# Patient Record
Sex: Male | Born: 1955 | Race: White | Hispanic: No | Smoking: Former smoker
Health system: Southern US, Community
[De-identification: ages and names within clinical notes are randomized; demographics above are authoritative.]

## PROBLEM LIST (undated history)

## (undated) DIAGNOSIS — E119 Type 2 diabetes mellitus without complications: Secondary | ICD-10-CM

## (undated) DIAGNOSIS — G20 Parkinson's disease: Secondary | ICD-10-CM

## (undated) DIAGNOSIS — F0280 Dementia in other diseases classified elsewhere without behavioral disturbance: Secondary | ICD-10-CM

## (undated) DIAGNOSIS — M5136 Other intervertebral disc degeneration, lumbar region: Secondary | ICD-10-CM

## (undated) DIAGNOSIS — N400 Enlarged prostate without lower urinary tract symptoms: Secondary | ICD-10-CM

## (undated) DIAGNOSIS — G309 Alzheimer's disease, unspecified: Secondary | ICD-10-CM

## (undated) HISTORY — DX: Dementia in other diseases classified elsewhere, unspecified severity, without behavioral disturbance, psychotic disturbance, mood disturbance, and anxiety: F02.80

## (undated) HISTORY — DX: Benign prostatic hyperplasia without lower urinary tract symptoms: N40.0

## (undated) HISTORY — DX: Alzheimer's disease, unspecified: G30.9

## (undated) HISTORY — DX: Other intervertebral disc degeneration, lumbar region: M51.36

## (undated) HISTORY — DX: Type 2 diabetes mellitus without complications: E11.9

## (undated) HISTORY — DX: Parkinson's disease: G20

## (undated) HISTORY — PX: NECK SURGERY: SHX720

## (undated) HISTORY — PX: BACK SURGERY: SHX140

---

## 2017-05-30 ENCOUNTER — Encounter: Payer: Self-pay | Admitting: Neurology

## 2017-06-15 ENCOUNTER — Encounter: Payer: Self-pay | Admitting: Neurology

## 2017-06-20 NOTE — Progress Notes (Signed)
Christopher Petersen was seen today in the movement disorders clinic for neurologic consultation at the request of Clayton Lefort, FNP.  The consultation is for the evaluation of PD.  The records that were made available to me were reviewed.   He is accompanied by his granddaughter who supplements the history.  Pt reports that he was seeing a neurologist in [place] (a Dr. Lennice Sites) who has since left.  I have no records of Dr. Seleta Rhymes.  Pt states that he was dx with PD about 6-8 months ago and was given samples of the neupro patch, 4 mg.  He took it for about 2 months and his granddaughter and he both state that it helped him move.  However, he couldn't afford the medication and he had to stop it.  Since then he is slower and his speech is worse.   Specific Symptoms:  Tremor: Yes.  , both hands, with use and with rest, and he feels tremor on the inside Family hx of similar:  Yes.  , maternal uncle with PD Voice: slurred speech, no hx of LSVT loud Sleep: trouble staying asleep  Vivid Dreams:  No.  Acting out dreams:  Yes.   (currently staying with daughter/grandkids as having trouble taking care of self - blood sugars were dropping per granddaughter) Wet Pillows: Yes.   Postural symptoms:  Yes.    Falls?  Yes.  , "right many" falls - last fall 2 weeks ago and fell off porch but no significant injury Bradykinesia symptoms: slow movements, difficulty getting out of a chair, difficulty regaining balance and "my feet outrun my body" Loss of smell:  No. Loss of taste:  No. Urinary Incontinence:  Yes.  , but no need to wear undergarment Difficulty Swallowing:  Yes.  , attributes to prior anterior cervical neck surgery Handwriting, micrographia: Yes.   Trouble with ADL's:  No. but he is slower than in the past  Trouble buttoning clothing: No. Depression:  "I don't know how to answer that" Memory changes:  Yes.   (able to remember medications; remembers to pay monthly bills; he drives but daughter doesn't  want him to) Hallucinations:  No.  visual distortions: Yes.   N/V:  No. Lightheaded:  No.  Syncope: No. Diplopia:  No. Dyskinesia:  No.  Neuroimaging of the brain has previously been performed.  It is not available for my review today.   CT brain done in 2015 and is reported to be normal.   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  PREVIOUS MEDICATIONS: neupro (helped but expensive)  ALLERGIES:  No Known Allergies  CURRENT MEDICATIONS:  Outpatient Encounter Medications as of 06/27/2017  Medication Sig  . ALPRAZolam (XANAX) 1 MG tablet Take 1 mg 3 (three) times daily as needed by mouth for anxiety.  Marland Kitchen aspirin EC 81 MG tablet Take 81 mg daily by mouth.  . furosemide (LASIX) 40 MG tablet Take 40 mg daily by mouth.  . gabapentin (NEURONTIN) 300 MG capsule Take 600 mg by mouth 4 (four) times daily.   Marland Kitchen HYDROcodone-acetaminophen (NORCO) 10-325 MG tablet Take 1 tablet every 6 (six) hours as needed by mouth.  . insulin detemir (LEVEMIR) 100 UNIT/ML injection Inject at bedtime into the skin.  Marland Kitchen insulin lispro (HUMALOG) 100 UNIT/ML injection Inject into the skin.  . Multiple Vitamin (MULTIVITAMIN) tablet Take 1 tablet daily by mouth.  . Omega-3 Fatty Acids (FISH OIL) 1000 MG CAPS Take  by mouth.  . [DISCONTINUED] insulin glargine (LANTUS) 100 UNIT/ML injection Inject into the skin.   No facility-administered encounter medications on file as of 06/27/2017.     PAST MEDICAL HISTORY:   Past Medical History:  Diagnosis Date  . Alzheimer's dementia   . BPH (benign prostatic hyperplasia)   . DDD (degenerative disc disease), lumbar   . Diabetes (HCC)   . Parkinson's disease (HCC)     PAST SURGICAL HISTORY:   Past Surgical History:  Procedure Laterality Date  . BACK SURGERY     x2  . NECK SURGERY     x2    SOCIAL HISTORY:   Social History   Socioeconomic History  . Marital status: Unknown    Spouse name: Not on file  .  Number of children: Not on file  . Years of education: Not on file  . Highest education level: Not on file  Social Needs  . Financial resource strain: Not on file  . Food insecurity - worry: Not on file  . Food insecurity - inability: Not on file  . Transportation needs - medical: Not on file  . Transportation needs - non-medical: Not on file  Occupational History  . Not on file  Tobacco Use  . Smoking status: Former Smoker    Last attempt to quit: 06/27/1997    Years since quitting: 20.0  . Smokeless tobacco: Never Used  Substance and Sexual Activity  . Alcohol use: No    Frequency: Never  . Drug use: No  . Sexual activity: Not on file  Other Topics Concern  . Not on file  Social History Narrative  . Not on file    FAMILY HISTORY:   Family Status  Relation Name Status  . Mother  Deceased  . Father  Deceased  . Sister 2 Alive  . Brother 3 Alive  . Child 1 Alive  . Sister 1 Deceased  . Brother 1 Deceased       MVA, seizure    ROS:  Having R hip pain.   A complete 10 system review of systems was obtained and was unremarkable apart from what is mentioned above.  PHYSICAL EXAMINATION:    VITALS:   Vitals:   06/27/17 1313  BP: 138/80  Pulse: 84  SpO2: 98%  Weight: 188 lb (85.3 kg)  Height: 6' (1.829 m)    GEN:  The patient appears stated age and is in NAD. HEENT:  Normocephalic, atraumatic.  The mucous membranes are moist. The superficial temporal arteries are without ropiness or tenderness. CV:  RRR Lungs:  CTAB Neck/HEME:  There are no carotid bruits bilaterally.  Neurological examination:  Orientation: The patient is alert and oriented x3. Fund of knowledge is appropriate, although he is slow to answer questions.  Recent and remote memory are intact.  Attention and concentration are normal.    Able to name objects and repeat phrases. Cranial nerves: There is good facial symmetry. There is facial hypomimia.  Pupils are equal round and reactive to light  bilaterally. Fundoscopic exam reveals clear margins bilaterally. Extraocular muscles are intact. The visual fields are full to confrontational testing. The speech is fluent and hypophonic and mildly dysarthric. Soft palate rises symmetrically and there is no tongue deviation. Hearing is intact to conversational tone. Sensation: Sensation is intact to light and pinprick throughout (facial, trunk, extremities). Vibration is absent at the bilateral big toe but intact at the ankle. There is no extinction with double simultaneous stimulation. There is no  sensory dermatomal level identified. Motor: Strength is 5/5 in the bilateral upper and lower extremities.   Shoulder shrug is equal and symmetric.  There is no pronator drift. Deep tendon reflexes: Deep tendon reflexes are 2/4 at the bilateral biceps, triceps, brachioradialis, patella and achilles. Plantar responses are downgoing bilaterally.  Movement examination: Tone: There is mild increased tone in the RUE.  Tone elsewhere is normal.   Abnormal movements: none Coordination:  There is minimal decremation with RAM's, with hand opening and closing on the right.  All other RAMs are normal with any form of RAMS, including alternating supination and pronation of the forearm,  finger taps, heel taps and toe taps. Gait and Station: The patient has no difficulty arising out of a deep-seated chair without the use of the hands. He drags the right leg with ambulation and has antalgic, festinating gait that is unstable.      ASSESSMENT/PLAN:  1.  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  We discussed treatment options as well as prognostic indicators.  Patient education was provided.  -We discussed that it used to be thought that levodopa would increase risk of melanoma but now it is believed that Parkinsons itself likely increases risk of melanoma. he is to get regular skin checks.  -Greater than 50%  of the 60 minute visit was spent in counseling answering questions and talking about what to expect now as well as in the future.  We talked about medication options as well as potential future surgical options.  We talked about safety in the home.  -We decided to add carbidopa/levodopa 25/100.  1/2 tab tid x 1 wk, then 1/2 in am & noon & 1 at night for a week, then 1/2 in am &1 at noon &night for a week, then 1 po tid.  Risks, benefits, side effects and alternative therapies were discussed.  The opportunity to ask questions was given and they were answered to the best of my ability.  The patient expressed understanding and willingness to follow the outlined treatment protocols.  -I will refer the patient for home health PT/ST (I asked him to hold on driving)  -We discussed community resources in the area including patient support groups and community exercise programs for PD and pt education was provided to the patient.  -patient lives far away and is closer to [place].  Offered to transfer care given distance to travel but he wishes to stay here for now and granddaughter will bring him.  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  -talked to him about his xanax.  Currently taking tid.  States that he is taking it for tremor.  Told him that I don't recommend this medication in his age group and especially in PD patients.  The carbidopa/levodopa alone should help with the feeling of inner tremor and I recommend he talk with prescribing physician about weaning.  2.  Memory loss  -I am not convinced he has AD, as he was previously told.  I don't think that neuropsych testing, however, would be of value right now as I think that much of cognitive change is due to meds (oxycodone, gabapentin, xanax).  I agree with no driving.  3.  Follow up is anticipated in the next few months, sooner should new neurologic issues arise.  Much greater than 50% of this  visit was spent in counseling and coordinating  care.  Total face to face time:  60 min  Cc:  [name], [name], PA-C

## 2017-06-27 ENCOUNTER — Ambulatory Visit: Payer: Medicare HMO | Admitting: Neurology

## 2017-06-27 ENCOUNTER — Encounter: Payer: Self-pay | Admitting: Neurology

## 2017-06-27 VITALS — BP 138/80 | HR 84 | Ht 72.0 in | Wt 188.0 lb

## 2017-06-27 DIAGNOSIS — G20 Parkinson's disease: Secondary | ICD-10-CM

## 2017-06-27 DIAGNOSIS — R413 Other amnesia: Secondary | ICD-10-CM

## 2017-06-27 MED ORDER — CARBIDOPA-LEVODOPA 25-100 MG PO TABS: 1.0000 | ORAL_TABLET | Freq: Three times a day (TID) | ORAL | 3 refills | Status: DC

## 2017-06-27 NOTE — Addendum Note (Signed)
Addended bySilvio Pate: Codie Hainer L on: 06/27/2017 02:36 PM   Modules accepted: Orders

## 2017-07-05 ENCOUNTER — Telehealth: Payer: Self-pay | Admitting: Neurology

## 2017-07-05 NOTE — Telephone Encounter (Signed)
Spoke with Christopher Petersen with [facility] PT and gave verbal orders to see patient 2x week for 4 weeks.

## 2017-07-07 ENCOUNTER — Telehealth: Payer: Self-pay | Admitting: Neurology

## 2017-07-07 DIAGNOSIS — R1319 Other dysphagia: Secondary | ICD-10-CM

## 2017-07-07 NOTE — Telephone Encounter (Signed)
[facility] for approval to see patient 1 x week for 9 weeks for ST - dysphagia/speech. She also was asking if we could order a modified barium swallow. Okay to order this?  We can call her back at [phone].

## 2017-07-07 NOTE — Telephone Encounter (Signed)
yes

## 2017-07-08 NOTE — Telephone Encounter (Signed)
[facility] swallow on 07/19/17 at 11:30 am. Please arrive 15 minutes prior and go to 1st floor radiology. If you need to reschedule for any reason please call [phone].  Patient made aware of information above.

## 2017-07-08 NOTE — Telephone Encounter (Signed)
Patient made aware and is agreeable to have MBE. Will set up.

## 2017-07-15 ENCOUNTER — Other Ambulatory Visit (HOSPITAL_COMMUNITY): Payer: Self-pay | Admitting: Neurology

## 2017-07-15 DIAGNOSIS — R1319 Other dysphagia: Secondary | ICD-10-CM

## 2017-07-19 ENCOUNTER — Ambulatory Visit (HOSPITAL_COMMUNITY)
Admission: RE | Admit: 2017-07-19 | Discharge: 2017-07-19 | Disposition: A | Discharge status: Home / Self Care | Payer: Medicare HMO | Source: Ambulatory Visit | Attending: Neurology | Admitting: Neurology

## 2017-07-19 ENCOUNTER — Ambulatory Visit (HOSPITAL_COMMUNITY): Admission: RE | Admit: 2017-07-19 | Payer: Medicare HMO | Source: Ambulatory Visit

## 2017-07-19 DIAGNOSIS — R1319 Other dysphagia: Secondary | ICD-10-CM

## 2017-07-21 ENCOUNTER — Other Ambulatory Visit (HOSPITAL_COMMUNITY): Payer: Self-pay | Admitting: Neurology

## 2017-07-21 DIAGNOSIS — R1310 Dysphagia, unspecified: Secondary | ICD-10-CM

## 2017-08-04 ENCOUNTER — Ambulatory Visit (HOSPITAL_COMMUNITY)
Admission: RE | Admit: 2017-08-04 | Discharge: 2017-08-04 | Disposition: A | Discharge status: Home / Self Care | Payer: Medicare HMO | Source: Ambulatory Visit | Attending: Neurology | Admitting: Neurology

## 2017-08-04 DIAGNOSIS — R1319 Other dysphagia: Secondary | ICD-10-CM | POA: Diagnosis present

## 2017-08-04 DIAGNOSIS — R1310 Dysphagia, unspecified: Secondary | ICD-10-CM | POA: Diagnosis not present

## 2017-08-04 DIAGNOSIS — R1312 Dysphagia, oropharyngeal phase: Secondary | ICD-10-CM | POA: Diagnosis not present

## 2017-08-04 NOTE — Progress Notes (Signed)
Modified Barium Swallow Progress Note  Patient Details  Name: Christopher ArtisSteve M Tome MRN: [id number] Date of Birth: 07-31-1956  Today's Date: 08/04/2017  Modified Barium Swallow completed.  Full report located under Chart Review in the Imaging Section.  Brief recommendations include the following:  Clinical Impression  Pt has a mild oropharyngeal dysphagia with likely improved pharyngeal clearance from prior MBS, but with persistent penetration of thin liquids that is only intermittently sensed as evidenced by a weak throat clearing. His oral phase is slow with premature spillage mostly of liquids. Impaired timing, decreased epiglottic deflection, and incomplete glottal closure all seem to contribute to his penetration, which cannot be prevented with use of postural strategies (of note, could only attempt a head turn right, as he cannot turn his head much to the left). Even with large cup sips of thin liquid he does not aspirate during this study, but he is not able to expel penetrates with a cued cough, making it likely that he aspirates over time.   Given that this is not an acute finding and that pt has not had prior episodes of PNA, discussed the potential for remaining on thin liquids with the pt and his granddaughter, if he continues to be active and perform thorough oral care. We also discussed the option of trying nectar thick liquids, which did not enter his airway despite challenging. He and his granddaughter mention that he may have an impending hip surgery ahead of him, and wish to be more cautious at this time. Therefore, would suggest solids as tolerated but with nectar thick liquids. HH SLP may also wish to consider the water protocol and exercises to maximize swallowing function and cough strength, such as EMST.    Swallow Evaluation Recommendations       SLP Diet Recommendations: Regular solids;Nectar thick liquid;Other (Comment)(could consider thin or at least water per protocol)   Liquid Administration via: Cup;Straw   Medication Administration: Whole meds with puree   Supervision: Patient able to self feed   Compensations: Slow rate;Small sips/bites;Minimize environmental distractions;Other (Comment)(may want to use intermittent cough with thin liquids)   Postural Changes: Seated upright at 90 degrees   Oral Care Recommendations: Oral care BID        [name], [name] 08/04/2017,2:20 PM   [name], M.A. CCC-SLP [phone]

## 2017-08-09 ENCOUNTER — Telehealth: Payer: Self-pay | Admitting: Neurology

## 2017-08-09 NOTE — Telephone Encounter (Signed)
Mary from [facility] called and needs the results of the swallow test sent to her. I tried to give her medical records phone number but she states that she needs it now since the patient is there. I called you and asked for a message to be put back   [facility] [phone]

## 2017-08-09 NOTE — Telephone Encounter (Signed)
Called Christopher Petersen back, it was stated from our front office that this was urgent and that patient was there with provider.   I spoke with her and she states that she is not with the patient- she just has a hard time getting information from medical records and just didn't want to go through that department. I did let her know that she should speak to medical records management if she has difficulty, but that is the process she should go through. She needs the note in the next couple days.   I will send copy of note as requested [facility] at [phone].

## 2017-08-15 ENCOUNTER — Telehealth: Payer: Self-pay | Admitting: Neurology

## 2017-08-15 NOTE — Telephone Encounter (Signed)
She wanted to let you know there was a missed visit last week due to a schedule conflict. Thanks

## 2017-08-15 NOTE — Telephone Encounter (Signed)
[facility]

## 2017-08-15 NOTE — Telephone Encounter (Signed)
Noted  

## 2017-10-03 NOTE — Progress Notes (Signed)
Christopher Petersen was seen today in the movement disorders clinic for neurologic consultation at the request of Christopher NegusSanders, [name], PA-C.  The consultation is for the evaluation of PD.  The records that were made available to me were reviewed.   He is accompanied by his granddaughter who supplements the history.  Pt reports that he was seeing a neurologist in [place] (a Dr. [name]) who has since left.  I have no records of Dr. [name]'s.  Pt states that he was dx with PD about 6-8 months ago and was given samples of the neupro patch, 4 mg.  He took it for about 2 months and his granddaughter and he both state that it helped him move.  However, he couldn't afford the medication and he had to stop it.  Since then he is slower and his speech is worse.  Neuroimaging of the brain has previously been performed.  It is not available for my review today.   CT brain done in 2015 and is reported to be normal.   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  10/04/17 update: The patient is seen today in follow-up.  The patient is accompanied by his granddaughter who supplements the history.  The patient was started on levodopa last visit and worked up to 1 tablet 3 times per day (6am/12pm/6pm).    Patient has had one fall since last visit, which occurred this AM.  He was trying to climb in the truck and he was on a hill and fell into the briars.  His granddaughter had to call and get him up.  States that carbidopa/levodopa helped him "make more sense."    No lightheadedness or near syncope.  No hallucinations.  Last visit, I did recommend that he talk to his prescribing physician about weaning him off of the Xanax.  He reports that he is taking that tid.  He reports that his hydrocodone was changed to oxycodone and he takes 4-5 per day.  Reviewing medical records, I see that patient saw Dr. [name] (not Dr. [name]) in the past.  DaT scan in 12/2015 indicated decreased uptake of the  radiotracer in the caudate on the left and absent uptake in the putamen bilaterally.  I have no notes from Dr. [name], only these radiology results.  PREVIOUS MEDICATIONS: neupro (helped but expensive)  ALLERGIES:  No Known Allergies  CURRENT MEDICATIONS:  Outpatient Encounter Medications as of 10/04/2017  Medication Sig  . ALPRAZolam (XANAX) 1 MG tablet Take 1 mg 3 (three) times daily as needed by mouth for anxiety.  Marland Kitchen. aspirin EC 81 MG tablet Take 81 mg daily by mouth.  . carbidopa-levodopa (SINEMET IR) 25-100 MG tablet Take 1 tablet by mouth 3 (three) times daily.  . furosemide (LASIX) 40 MG tablet Take 40 mg daily by mouth.  . gabapentin (NEURONTIN) 300 MG capsule Take 600 mg by mouth 4 (four) times daily.   . insulin detemir (LEVEMIR) 100 UNIT/ML injection Inject at bedtime into the skin.  Marland Kitchen. insulin lispro (HUMALOG) 100 UNIT/ML injection Inject into the skin.  . Multiple Vitamin (MULTIVITAMIN) tablet Take 1 tablet daily by mouth.  . Omega-3 Fatty Acids (FISH OIL) 1000 MG CAPS Take by mouth.  . oxyCODONE-acetaminophen (PERCOCET/ROXICET) 5-325 MG tablet Take by mouth every 4 (four) hours as needed for severe pain.  . [DISCONTINUED] HYDROcodone-acetaminophen (NORCO) 10-325 MG tablet Take 1 tablet every 6 (six) hours as needed by mouth.  No facility-administered encounter medications on file as of 10/04/2017.     PAST MEDICAL HISTORY:   Past Medical History:  Diagnosis Date  . Alzheimer's dementia   . BPH (benign prostatic hyperplasia)   . DDD (degenerative disc disease), lumbar   . Diabetes (HCC)   . Parkinson's disease (HCC)     PAST SURGICAL HISTORY:   Past Surgical History:  Procedure Laterality Date  . BACK SURGERY     x2  . NECK SURGERY     x2    SOCIAL HISTORY:   Social History   Socioeconomic History  . Marital status: Unknown    Spouse name: Not on file  . Number of children: Not on file  . Years of education: Not on file  . Highest education level: Not on  file  Social Needs  . Financial resource strain: Not on file  . Food insecurity - worry: Not on file  . Food insecurity - inability: Not on file  . Transportation needs - medical: Not on file  . Transportation needs - non-medical: Not on file  Occupational History  . Occupation: disability    Comment: Merchandiser, retail;   Tobacco Use  . Smoking status: Former Smoker    Last attempt to quit: 06/27/1997    Years since quitting: 20.2  . Smokeless tobacco: Never Used  Substance and Sexual Activity  . Alcohol use: No    Frequency: Never  . Drug use: No  . Sexual activity: Not on file  Other Topics Concern  . Not on file  Social History Narrative  . Not on file    FAMILY HISTORY:   Family Status  Relation Name Status  . Mother  Deceased  . Father  Deceased  . Sister 2 Alive  . Brother 3 Alive  . Child 1 Alive  . Sister 1 Deceased  . Brother 1 Deceased       MVA, seizure    ROS:  Still with R hip pain.  A complete 10 system review of systems was obtained and was unremarkable apart from what is mentioned above.  PHYSICAL EXAMINATION:    VITALS:   Vitals:   10/04/17 1449  BP: 128/64  Pulse: 82  SpO2: 93%  Weight: 199 lb (90.3 kg)  Height: 6' (1.829 m)    GEN:  The patient appears stated age and is in NAD. HEENT:  Normocephalic, atraumatic.  The mucous membranes are moist. The superficial temporal arteries are without ropiness or tenderness. CV:  RRR Lungs:  CTAB Neck/HEME:  There are no carotid bruits bilaterally.  Neurological examination:  Orientation: The patient is alert and oriented x3. He is slow to answer today and looks some to granddaughter for assist. Cranial nerves: There is good facial symmetry. There is facial hypomimia.  Pupils are equal round and reactive to light bilaterally. Fundoscopic exam reveals clear margins bilaterally.  The speech is fluent and hypophonic and mildly dysarthric (same as last visit). Soft palate rises symmetrically and there is no  tongue deviation. Hearing is intact to conversational tone. Sensation: Sensation is intact to light and pinprick throughout (facial, trunk, extremities). Vibration is absent at the bilateral big toe but intact at the ankle. There is no extinction with double simultaneous stimulation. There is no sensory dermatomal level identified. Motor: Strength is 5/5 in the bilateral upper and lower extremities.   Shoulder shrug is equal and symmetric.  There is no pronator drift.   Movement examination: Tone: There is mild increased tone in the  RUE.  Tone elsewhere is normal.   Abnormal movements: rare resting tremor on the right Coordination:  There is minimal decremation with RAM's, with hand opening and closing on the right.  All other RAMs are normal with any form of RAMS, including alternating supination and pronation of the forearm,  finger taps, heel taps and toe taps. Gait and Station: The patient has mild difficulty arising out of a deep-seated chair without the use of the hands and falls back the first time. He then gets up without the use of the hands.  He festinates and drags the right leg and has antalgic gait.  He is very unstable  ASSESSMENT/PLAN:  1.  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  We discussed treatment options as well as prognostic indicators.  Patient education was provided.  -We discussed that it used to be thought that levodopa would increase risk of melanoma but now it is believed that Parkinsons itself likely increases risk of melanoma. he is to get regular skin checks.  -He will continue carbidopa/levodopa 25/100, 1 tablet 3 times per day.  -Home health was sent last visit for PT/ST, but he admits that he did it for a while and then canceled it.  He did not think it was very helpful.  -patient lives far away and is closer to [employer].  Offered to transfer care given distance to travel but he wishes to stay here  for now and granddaughter will bring him.  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  -I had quite a long discussion with the patient and his granddaughter today about his pain and anxiety medications.  He was just changed to oxycodone and is taking this 5 times a day along with Xanax 3 times per day.  He is also on gabapentin.  I told him that these medications cause cognitive dulling (present today) along with balance change.  I explained to him that Parkinson's disease can, eventually, because these things as well and these medications, in my opinion, are not appropriate for him.  He reports that he is going to be seeing his surgeon that did his back surgery last year (Dr. Gwendlyn Deutscher) and is hoping that surgery either on his hip or back well help the pain and then he hopes to wean off.  Regardless, I told him that I do not think this combination of medications is appropriate and he should talk with his prescribing nurse practitioner at [facility].  -Pt living with granddaughter and she is providing care and transportation  2.  Memory loss  -I am not convinced he has AD, as he was previously told.  I don't think that neuropsych testing, however, would be of value right now as I think that much of cognitive change is due to meds (oxycodone, gabapentin, xanax).  We discussed this again in detail today.  3.  Follow up is anticipated in the next few months, sooner should new neurologic issues arise.  Much greater than 50% of this visit was spent in counseling and coordinating care.  Total face to face time:  25 min  Cc:  Christopher Negus, PA-C

## 2017-10-04 ENCOUNTER — Encounter: Payer: Self-pay | Admitting: Neurology

## 2017-10-04 ENCOUNTER — Ambulatory Visit: Payer: Medicare HMO | Admitting: Neurology

## 2017-10-04 VITALS — BP 128/64 | HR 82 | Ht 72.0 in | Wt 199.0 lb

## 2017-10-04 DIAGNOSIS — G20 Parkinson's disease: Secondary | ICD-10-CM

## 2017-10-04 DIAGNOSIS — M25551 Pain in right hip: Secondary | ICD-10-CM | POA: Diagnosis not present

## 2017-10-04 DIAGNOSIS — R413 Other amnesia: Secondary | ICD-10-CM | POA: Diagnosis not present

## 2017-10-04 MED ORDER — CARBIDOPA-LEVODOPA 25-100 MG PO TABS: 1.0000 | ORAL_TABLET | Freq: Three times a day (TID) | ORAL | 1 refills | Status: DC

## 2017-10-05 ENCOUNTER — Encounter: Payer: Self-pay | Admitting: Neurology

## 2017-10-10 ENCOUNTER — Telehealth: Payer: Self-pay | Admitting: Neurology

## 2017-10-10 NOTE — Telephone Encounter (Signed)
Records requested from [facility].

## 2017-10-10 NOTE — Telephone Encounter (Signed)
[facility].  Patient was receiving his Xanax from Mr. Allyne GeeSanders, [place].  Patient is receiving #150 of oxycodone per month via NP Christie BeckersLuke Benseke.  Will try to get his records.  Records obtained from Mr Allyne GeeSanders.

## 2017-11-04 ENCOUNTER — Telehealth: Payer: Self-pay | Admitting: Neurology

## 2017-11-04 NOTE — Telephone Encounter (Signed)
[facility]  The patient has been receiving 150 tablets/month of oxycodone 10/325 as well as Xanax 1 mg 3 times per day.  Prescribing nurse practitioner is Hyman HopesLuke Brenseke.  PD diagnosis is known to him per records.

## 2018-02-03 ENCOUNTER — Ambulatory Visit: Payer: Medicare HMO | Admitting: Neurology

## 2018-03-20 NOTE — Progress Notes (Signed)
Christopher Petersen was seen today in the movement disorders clinic for neurologic consultation at the request of Christopher Petersen, [name], PA-C.  The consultation is for the evaluation of PD.  The records that were made available to me were reviewed.   He is accompanied by his granddaughter who supplements the history.  Pt reports that he was seeing a neurologist in [place] (a Dr. [name]) who has since left.  I have no records of Dr. [name]'s.  Pt states that he was dx with PD about 6-8 months ago and was given samples of the neupro patch, 4 mg.  He took it for about 2 months and his granddaughter and he both state that it helped him move.  However, he couldn't afford the medication and he had to stop it.  Since then he is slower and his speech is worse.  Neuroimaging of the brain has previously been performed.  It is not available for my review today.   CT brain done in 2015 and is reported to be normal.   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  10/04/17 update: The patient is seen today in follow-up.  The patient is accompanied by his granddaughter who supplements the history.  The patient was started on levodopa last visit and worked up to 1 tablet 3 times per day (6am/12pm/6pm).    Patient has had one fall since last visit, which occurred this AM.  He was trying to climb in the truck and he was on a hill and fell into the briars.  His granddaughter had to call and get him up.  States that carbidopa/levodopa helped him "make more sense."    No lightheadedness or near syncope.  No hallucinations.  Last visit, I did recommend that he talk to his prescribing physician about weaning him off of the Xanax.  He reports that he is taking that tid.  He reports that his hydrocodone was changed to oxycodone and he takes 4-5 per day.  Reviewing medical records, I see that patient saw Dr. [name] (not Dr. [name]) in the past.  DaT scan in 12/2015 indicated decreased uptake of the  radiotracer in the caudate on the left and absent uptake in the putamen bilaterally.  I have no notes from Dr. [name], only these radiology results.  03/23/18 update: Patient is seen today in follow-up for Parkinson's disease.  The patient is on carbidopa/levodopa 25/100, 1 tablet 3 times per day (7am/noon/5-6pm).  No wearing off of medication.  No tremor.  Records are reviewed since last visit.  The patient had lumbar laminectomy and decompression surgery on January 20, 2018.  PMP aware is reviewed.  "Surgery went real good and I don't have any pain in my hip.  I still have pain in my back."  No falls.  Patient is still on Xanax, 1 mg 3 times per day.  Oxycodone/acetaminophen was filled with 80 tablets on July 10 and July 24 and another 150 tablets were dispensed.   PREVIOUS MEDICATIONS: neupro (helped but expensive)  ALLERGIES:  No Known Allergies  CURRENT MEDICATIONS:  Outpatient Encounter Medications as of 03/23/2018  Medication Sig  . ALPRAZolam (XANAX) 1 MG tablet Take 1 mg 3 (three) times daily as needed by mouth for anxiety.  . carbidopa-levodopa (SINEMET IR) 25-100 MG tablet Take 1 tablet by mouth 3 (three) times daily.  . furosemide (LASIX) 40 MG tablet Take 40 mg daily by mouth.  . gabapentin (  NEURONTIN) 300 MG capsule Take 600 mg by mouth 4 (four) times daily.   . insulin detemir (LEVEMIR) 100 UNIT/ML injection Inject at bedtime into the skin.  Marland Kitchen. insulin lispro (HUMALOG) 100 UNIT/ML injection Inject into the skin.  . Omega-3 Fatty Acids (FISH OIL) 1000 MG CAPS Take by mouth.  . oxyCODONE-acetaminophen (PERCOCET/ROXICET) 5-325 MG tablet Take by mouth every 4 (four) hours as needed for severe pain.  . [DISCONTINUED] aspirin EC 81 MG tablet Take 81 mg daily by mouth.  . [DISCONTINUED] Multiple Vitamin (MULTIVITAMIN) tablet Take 1 tablet daily by mouth.   No facility-administered encounter medications on file as of 03/23/2018.     PAST MEDICAL HISTORY:   Past Medical History:  Diagnosis  Date  . Alzheimer's dementia   . BPH (benign prostatic hyperplasia)   . DDD (degenerative disc disease), lumbar   . Diabetes (HCC)   . Parkinson's disease (HCC)     PAST SURGICAL HISTORY:   Past Surgical History:  Procedure Laterality Date  . BACK SURGERY     x2  . NECK SURGERY     x2    SOCIAL HISTORY:   Social History   Socioeconomic History  . Marital status: Unknown    Spouse name: Not on file  . Number of children: Not on file  . Years of education: Not on file  . Highest education level: Not on file  Occupational History  . Occupation: disability    Comment: Merchandiser, retailsupervisor;   Social Needs  . Financial resource strain: Not on file  . Food insecurity:    Worry: Not on file    Inability: Not on file  . Transportation needs:    Medical: Not on file    Non-medical: Not on file  Tobacco Use  . Smoking status: Former Smoker    Last attempt to quit: 06/27/1997    Years since quitting: 20.7  . Smokeless tobacco: Never Used  Substance and Sexual Activity  . Alcohol use: No    Frequency: Never  . Drug use: No  . Sexual activity: Not on file  Lifestyle  . Physical activity:    Days per week: Not on file    Minutes per session: Not on file  . Stress: Not on file  Relationships  . Social connections:    Talks on phone: Not on file    Gets together: Not on file    Attends religious service: Not on file    Active member of club or organization: Not on file    Attends meetings of clubs or organizations: Not on file    Relationship status: Not on file  . Intimate partner violence:    Fear of current or ex partner: Not on file    Emotionally abused: Not on file    Physically abused: Not on file    Forced sexual activity: Not on file  Other Topics Concern  . Not on file  Social History Narrative  . Not on file    FAMILY HISTORY:   Family Status  Relation Name Status  . Mother  Deceased  . Father  Deceased  . Sister 2 Alive  . Brother 3 Alive  . Child 1  Alive  . Sister 1 Deceased  . Brother 1 Deceased       MVA, seizure    ROS:  Review of Systems  Constitutional: Negative.   HENT: Negative.   Eyes: Negative.   Respiratory: Negative.   Cardiovascular: Negative.   Musculoskeletal: Positive for  back pain.  Skin: Negative.   Endo/Heme/Allergies: Negative.     PHYSICAL EXAMINATION:    VITALS:   Vitals:   03/23/18 1027  BP: (!) 148/72  Pulse: 74  SpO2: 92%  Weight: 194 lb (88 kg)  Height: 6' (1.829 m)    GEN:  The patient appears stated age and is in NAD. HEENT:  Normocephalic, atraumatic.  The mucous membranes are moist. The superficial temporal arteries are without ropiness or tenderness. CV:  RRR Lungs:  CTAB Neck/HEME:  There are no carotid bruits bilaterally.  Neurological examination:  Orientation: The patient writes correctly the month/date/year.  Scores 2/4 on clock drawing.   Cranial nerves: There is good facial symmetry. There is facial hypomimia.  His speech is fluent.  It is hypophonic.  It is dysarthric. Marland Kitchen Soft palate rises symmetrically and there is no tongue deviation. Hearing is intact to conversational tone. Sensation: Sensation is intact to light touch throughout. Motor: Strength is at least antigravity x4.   Movement examination: Tone: The patient has difficulty relaxing for testing tone.  Tone appears to be just mildly increased in the bilateral upper extremities. Abnormal movements: No tremor is noted today. Coordination:  There is minimal decremation with RAM's, with hand opening and closing on the right (same as previous).  All other RAMs are normal with any form of RAMS, including alternating supination and pronation of the forearm,  finger taps, heel taps and toe taps. Gait and Station: The patient pushes off of the chair to arise and is successful in the second attempt.  He carries his cane.  He drags the right leg.    ASSESSMENT/PLAN:  1.  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  We discussed treatment options as well as prognostic indicators.  Patient education was provided.  -We discussed that it used to be thought that levodopa would increase risk of melanoma but now it is believed that Parkinsons itself likely increases risk of melanoma. he is to get regular skin checks.  -Continue carbidopa/levodopa 25/100, 1 tablet 3 times per day.  -Again, offered transfer to a [facility] neurologist given the distance to travel here, but he again expresses desire to stay here.  -Discussed living situation.  He plans to be moving back into his own home (currently living with relatives) but his granddaughter is going to be moving in with him.    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  2.  Memory loss  -I am not convinced he has AD, as he was previously told.  I don't think that neuropsych testing, however, would be of value right now as I think that much of cognitive change is due to meds (oxycodone, gabapentin, xanax).  We talked about this today again. I did call treating PA to express concern and left number with staff for call back.   3.  Much greater than 50% of this visit was spent in counseling and coordinating care.  Total face to face time:  25 min  Cc:  Christopher Negus, PA-C

## 2018-03-23 ENCOUNTER — Telehealth: Payer: Self-pay | Admitting: Neurology

## 2018-03-23 ENCOUNTER — Encounter: Payer: Self-pay | Admitting: Neurology

## 2018-03-23 ENCOUNTER — Ambulatory Visit: Payer: Medicare HMO | Admitting: Neurology

## 2018-03-23 VITALS — BP 148/72 | HR 74 | Ht 72.0 in | Wt 194.0 lb

## 2018-03-23 DIAGNOSIS — G20 Parkinson's disease: Secondary | ICD-10-CM

## 2018-03-23 NOTE — Telephone Encounter (Signed)
Spoke with PA Elder Negusavid Sanders about my concerns.  He states that pain management is now RX oxycodone but he will reach out to them.  He was surprised about amount of oxycodone in July.  He stated that he is the RX for the xanax and will try to start weaning patient.  Appreciated my phone call.

## 2018-03-23 NOTE — Patient Instructions (Signed)
Continue carbidopa/levodopa 25/100, 1 tablet three times per day.  Exercise is important.  You need to do safe, cardiovascular exercise.

## 2018-04-12 ENCOUNTER — Other Ambulatory Visit: Payer: Self-pay | Admitting: Neurology

## 2018-04-27 ENCOUNTER — Telehealth: Payer: Self-pay | Admitting: Neurology

## 2018-04-27 NOTE — Telephone Encounter (Signed)
Referral made to [facility] for next available appt. Scheduled with Dr. Rubin Payor for 02/27/19 at 9:20 am. [facility] letting patient know about appt. Did not cancel follow up with Dr. Arbutus Leas since this appt is booked so far out. He is to call with questions and they are sending a new patient packet. Records faxed to [facility] at [phone] with confirmation received.

## 2018-04-27 NOTE — Telephone Encounter (Signed)
Sure.  Christopher Petersen can remove from my schedule if he wishes to transfer care.  He travels a long way to get here.

## 2018-04-27 NOTE — Telephone Encounter (Signed)
Would you like me to refer to [facility]?

## 2018-04-27 NOTE — Telephone Encounter (Signed)
Patient called to say that he has changed his mind regarding the referral to [facility]. He would like the Referral now. Please Call. Thanks

## 2018-09-24 NOTE — Progress Notes (Deleted)
Christopher Petersen was seen today in the movement disorders clinic for neurologic consultation at the request of Elder Negus, PA-C.  The consultation is for the evaluation of PD.  The records that were made available to me were reviewed.   He is accompanied by his granddaughter who supplements the history.  Pt reports that he was seeing a neurologist in [place] (a Dr. Lennice Sites) who has since left.  I have no records of Dr. Seleta Rhymes.  Pt states that he was dx with PD about 6-8 months ago and was given samples of the neupro patch, 4 mg.  He took it for about 2 months and his granddaughter and he both state that it helped him move.  However, he couldn't afford the medication and he had to stop it.  Since then he is slower and his speech is worse.  Neuroimaging of the brain has previously been performed.  It is not available for my review today.   CT brain done in 2015 and is reported to be normal.   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  10/04/17 update: The patient is seen today in follow-up.  The patient is accompanied by his granddaughter who supplements the history.  The patient was started on levodopa last visit and worked up to 1 tablet 3 times per day (6am/12pm/6pm).    Patient has had one fall since last visit, which occurred this AM.  He was trying to climb in the truck and he was on a hill and fell into the briars.  His granddaughter had to call and get him up.  States that carbidopa/levodopa helped him "make more sense."    No lightheadedness or near syncope.  No hallucinations.  Last visit, I did recommend that he talk to his prescribing physician about weaning him off of the Xanax.  He reports that he is taking that tid.  He reports that his hydrocodone was changed to oxycodone and he takes 4-5 per day.  Reviewing medical records, I see that patient saw Dr. Hale Bogus (not Dr. Lennice Sites) in the past.  DaT scan in 12/2015 indicated decreased uptake of the  radiotracer in the caudate on the left and absent uptake in the putamen bilaterally.  I have no notes from Dr. Hale Bogus, only these radiology results.  03/23/18 update: Patient is seen today in follow-up for Parkinson's disease.  The patient is on carbidopa/levodopa 25/100, 1 tablet 3 times per day (7am/noon/5-6pm).  No wearing off of medication.  No tremor.  Records are reviewed since last visit.  The patient had lumbar laminectomy and decompression surgery on January 20, 2018.  PMP aware is reviewed.  "Surgery went real good and I don't have any pain in my hip.  I still have pain in my back."  No falls.  Patient is still on Xanax, 1 mg 3 times per day.  Oxycodone/acetaminophen was filled with 80 tablets on July 10 and July 24 and another 150 tablets were dispensed.   09/25/18 update: pt seen in f/u for PD.  He is on carbidopa/levodopa 25/100 tid.  Pt denies falls.  Pt denies lightheadedness, near syncope.  No hallucinations.  Mood has been good.  He has requested transfer to [facility] because of distance to travel.  We made that referral last sept but appointment is still not until July, so he f/u here today. PMP aware is reviewed.  Still on xanax tid but has gone from  1 mg to 0.5 mg.  Still remains on a signficant amount of oxycodone/acetaminophen.    PREVIOUS MEDICATIONS: neupro (helped but expensive)  ALLERGIES:  No Known Allergies  CURRENT MEDICATIONS:  Outpatient Encounter Medications as of 09/25/2018  Medication Sig  . ALPRAZolam (XANAX) 1 MG tablet Take 1 mg 3 (three) times daily as needed by mouth for anxiety.  . carbidopa-levodopa (SINEMET IR) 25-100 MG tablet TAKE 1 TABLET BY MOUTH THREE TIMES DAILY  . furosemide (LASIX) 40 MG tablet Take 40 mg daily by mouth.  . gabapentin (NEURONTIN) 300 MG capsule Take 600 mg by mouth 4 (four) times daily.   . insulin detemir (LEVEMIR) 100 UNIT/ML injection Inject at bedtime into the skin.  Marland Kitchen. insulin lispro (HUMALOG) 100 UNIT/ML injection Inject into  the skin.  . Omega-3 Fatty Acids (FISH OIL) 1000 MG CAPS Take by mouth.  . oxyCODONE-acetaminophen (PERCOCET/ROXICET) 5-325 MG tablet Take by mouth every 4 (four) hours as needed for severe pain.   No facility-administered encounter medications on file as of 09/25/2018.     PAST MEDICAL HISTORY:   Past Medical History:  Diagnosis Date  . Alzheimer's dementia   . BPH (benign prostatic hyperplasia)   . DDD (degenerative disc disease), lumbar   . Diabetes (HCC)   . Parkinson's disease (HCC)     PAST SURGICAL HISTORY:   Past Surgical History:  Procedure Laterality Date  . BACK SURGERY     x2  . NECK SURGERY     x2    SOCIAL HISTORY:   Social History   Socioeconomic History  . Marital status: Unknown    Spouse name: Not on file  . Number of children: Not on file  . Years of education: Not on file  . Highest education level: Not on file  Occupational History  . Occupation: disability    Comment: Merchandiser, retailsupervisor;   Social Needs  . Financial resource strain: Not on file  . Food insecurity:    Worry: Not on file    Inability: Not on file  . Transportation needs:    Medical: Not on file    Non-medical: Not on file  Tobacco Use  . Smoking status: Former Smoker    Last attempt to quit: 06/27/1997    Years since quitting: 21.2  . Smokeless tobacco: Never Used  Substance and Sexual Activity  . Alcohol use: No    Frequency: Never  . Drug use: No  . Sexual activity: Not on file  Lifestyle  . Physical activity:    Days per week: Not on file    Minutes per session: Not on file  . Stress: Not on file  Relationships  . Social connections:    Talks on phone: Not on file    Gets together: Not on file    Attends religious service: Not on file    Active member of club or organization: Not on file    Attends meetings of clubs or organizations: Not on file    Relationship status: Not on file  . Intimate partner violence:    Fear of current or ex partner: Not on file     Emotionally abused: Not on file    Physically abused: Not on file    Forced sexual activity: Not on file  Other Topics Concern  . Not on file  Social History Narrative  . Not on file    FAMILY HISTORY:   Family Status  Relation Name Status  . Mother  Deceased  . Father  Deceased  . Sister 2 Alive  . Brother 3 Alive  . Child 1 Alive  . Sister 1 Deceased  . Brother 1 Deceased       MVA, seizure    ROS:  ROS  PHYSICAL EXAMINATION:    VITALS:   There were no vitals filed for this visit.  GEN:  The patient appears stated age and is in NAD. HEENT:  Normocephalic, atraumatic.  The mucous membranes are moist. The superficial temporal arteries are without ropiness or tenderness. CV:  RRR Lungs:  CTAB Neck/HEME:  There are no carotid bruits bilaterally.  Neurological examination:  Orientation: The patient writes correctly the month/date/year.  Scores 2/4 on clock drawing.   Cranial nerves: There is good facial symmetry. There is facial hypomimia.  His speech is fluent.  It is hypophonic.  It is dysarthric. Marland Kitchen Soft palate rises symmetrically and there is no tongue deviation. Hearing is intact to conversational tone. Sensation: Sensation is intact to light touch throughout. Motor: Strength is at least antigravity x4.   Movement examination: Tone: The patient has difficulty relaxing for testing tone.  Tone appears to be just mildly increased in the bilateral upper extremities. Abnormal movements: No tremor is noted today. Coordination:  There is minimal decremation with RAM's, with hand opening and closing on the right (same as previous).  All other RAMs are normal with any form of RAMS, including alternating supination and pronation of the forearm,  finger taps, heel taps and toe taps. Gait and Station: The patient pushes off of the chair to arise and is successful in the second attempt.  He carries his cane.  He drags the right leg.    ASSESSMENT/PLAN:  1.  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  We discussed treatment options as well as prognostic indicators.  Patient education was provided.  -We discussed that it used to be thought that levodopa would increase risk of melanoma but now it is believed that Parkinsons itself likely increases risk of melanoma. he is to get regular skin checks.  -Continue carbidopa/levodopa 25/100, 1 tablet 3 times per day.  -Again, offered transfer to a [facility] neurologist given the distance to travel here, but he again expresses desire to stay here.  -Discussed living situation.  He plans to be moving back into his own home (currently living with relatives) but his granddaughter is going to be moving in with him.    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  2.  Memory loss  -I am not convinced he has AD, as he was previously told.  I don't think that neuropsych testing, however, would be of value right now as I think that much of cognitive change is due to meds (oxycodone, gabapentin, xanax).  I have previously talked with treating PA.  He has decreased dose of xanax but still takes tid and takes significant number of oxycodone per month  3.  Much greater than 50% of this visit was spent in counseling and coordinating care.  Total face to face time:  25 min  Cc:  Elder Negus, PA-C (Inactive)

## 2018-09-25 ENCOUNTER — Ambulatory Visit: Payer: Medicare HMO | Admitting: Neurology

## 2018-11-17 IMAGING — RF DG SWALLOWING FUNCTION
1 series · 17 of 24 positions shown · non-contrast
Comparison: None.

CLINICAL DATA: Dysphagia and coughing.  Parkinson' s disease.

EXAM:
MODIFIED BARIUM SWALLOW
TECHNIQUE: Different consistencies of barium were administered orally to the
patient by the Speech Pathologist. Imaging of the pharynx was
performed in the lateral projection.
FLUOROSCOPY TIME:  Fluoroscopy Time:  2 minutes, 23 seconds
Radiation Exposure Index (if provided by the fluoroscopic device):
N/A
Number of Acquired Spot Images: 0

[Series 1: run · 24 acquisitions, 17 frames shown]
[im 1/24]
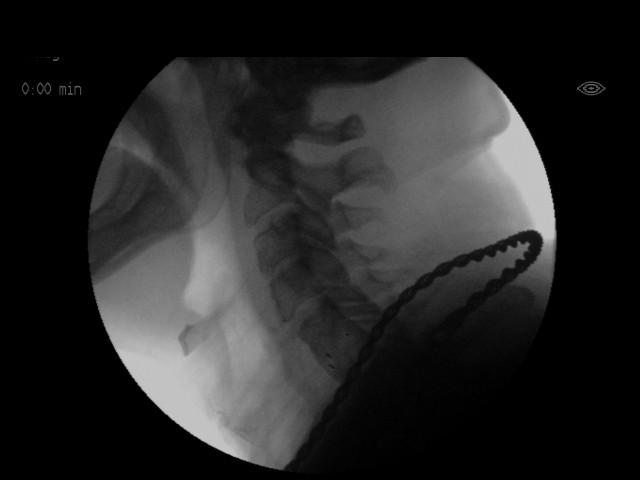
[im 3/24]
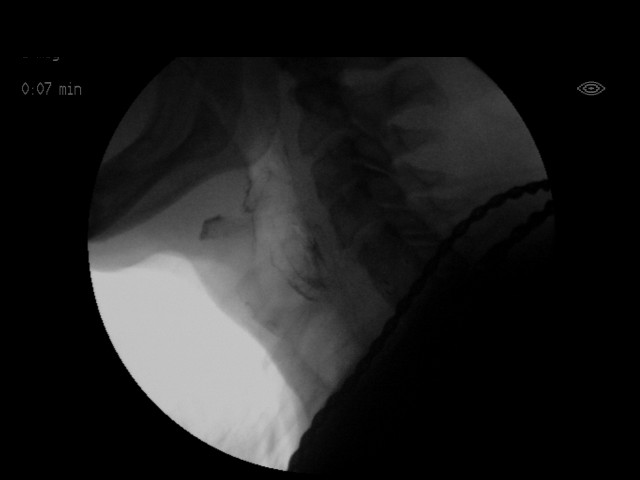
[im 4/24]
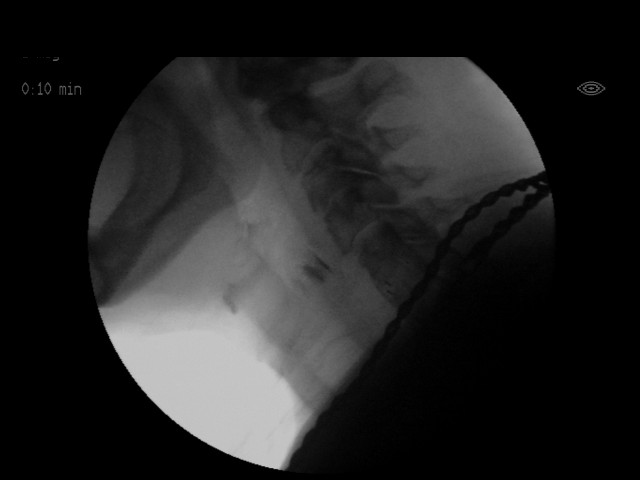
[im 5/24]
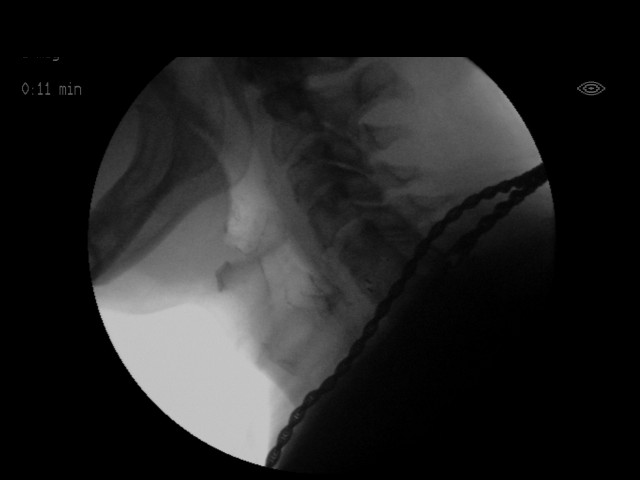
[im 7/24]
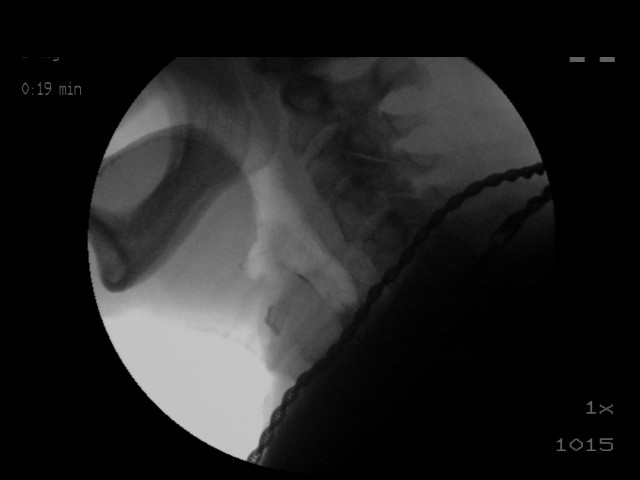
[im 8/24]
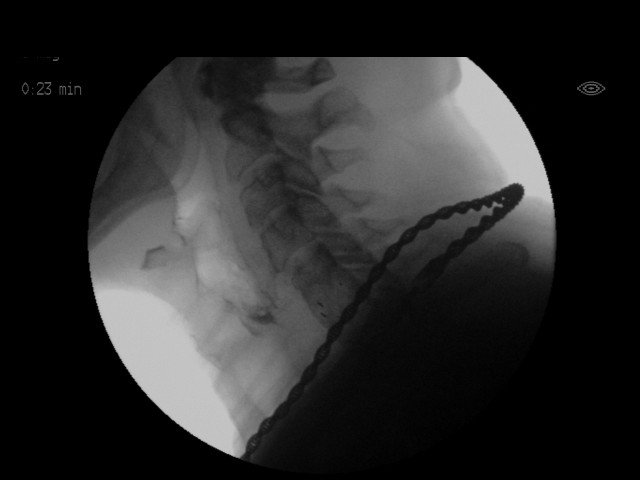
[im 10/24]
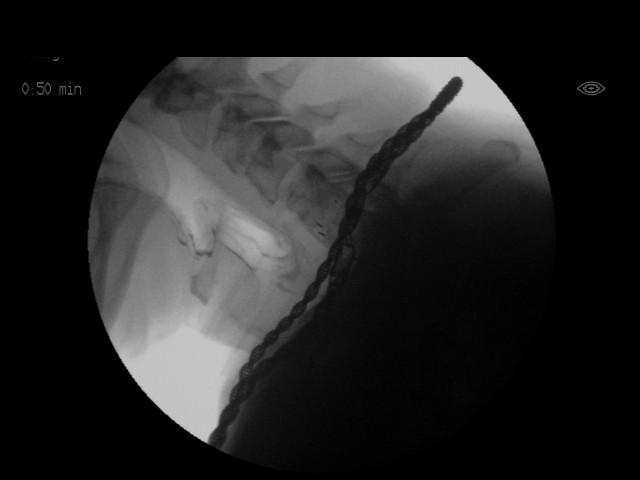
[im 11/24]
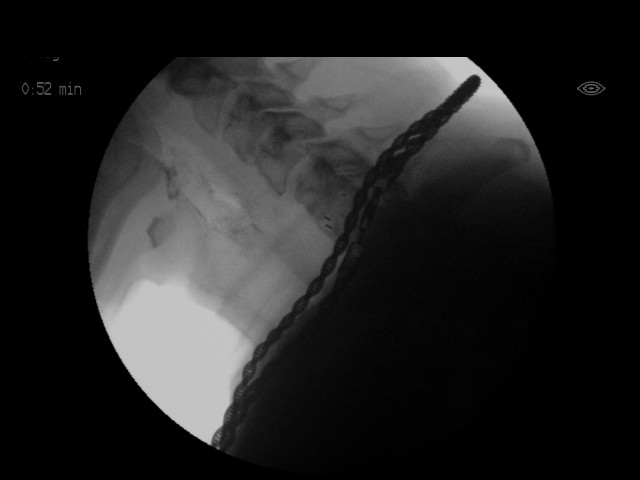
[im 13/24]
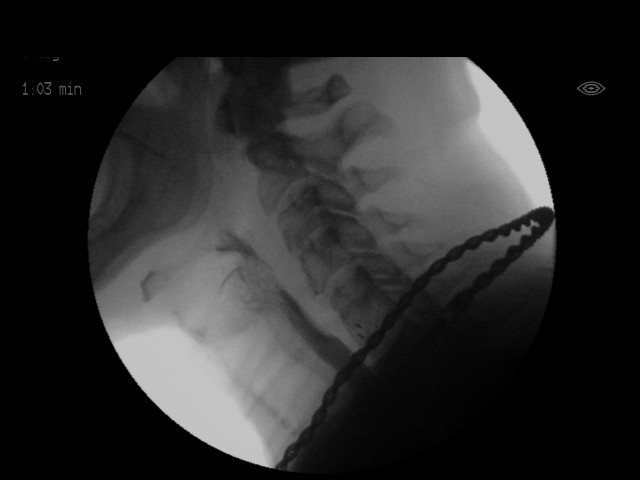
[im 14/24]
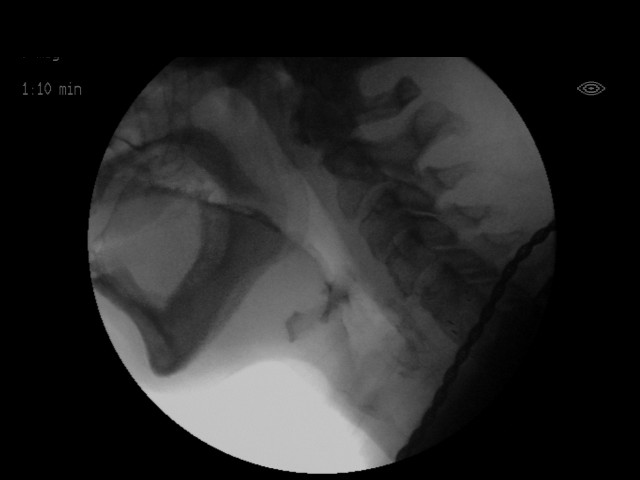
[im 15/24]
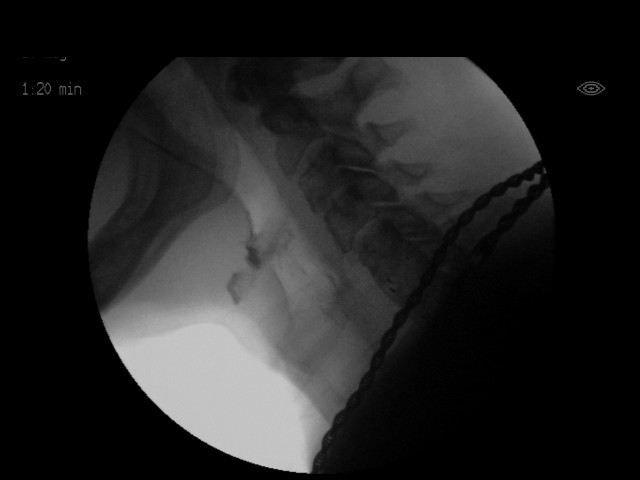
[im 17/24]
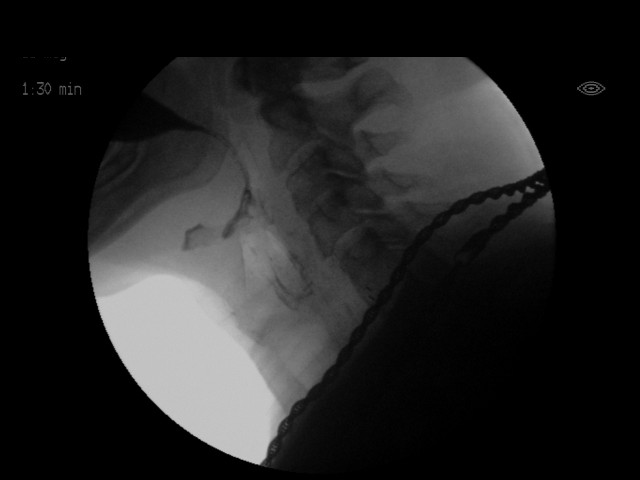
[im 18/24]
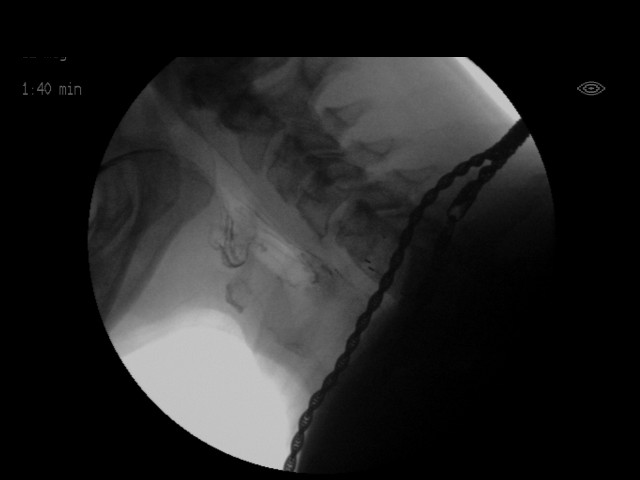
[im 20/24]
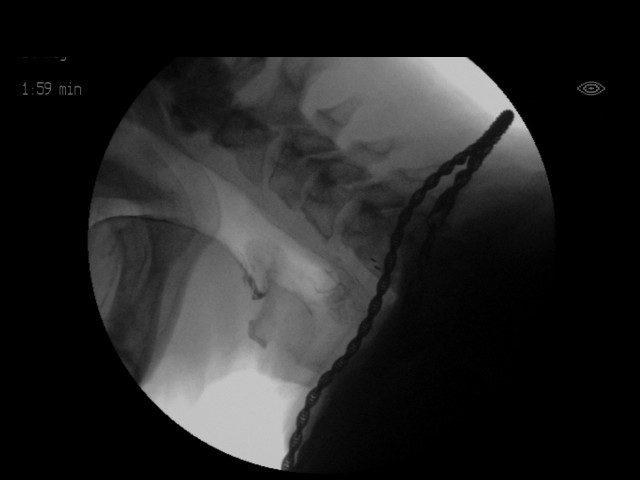
[im 21/24]
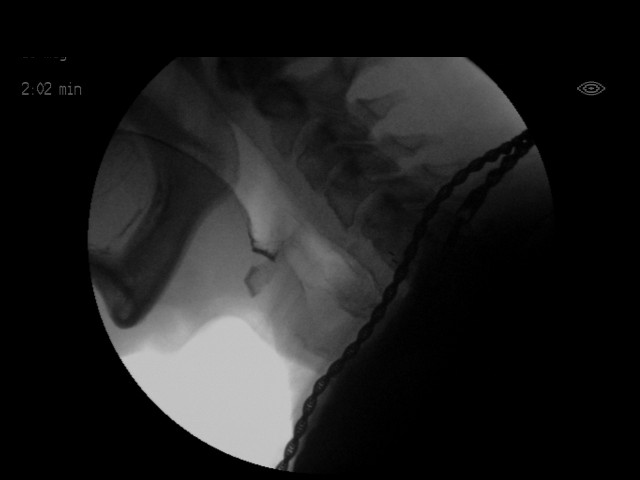
[im 22/24]
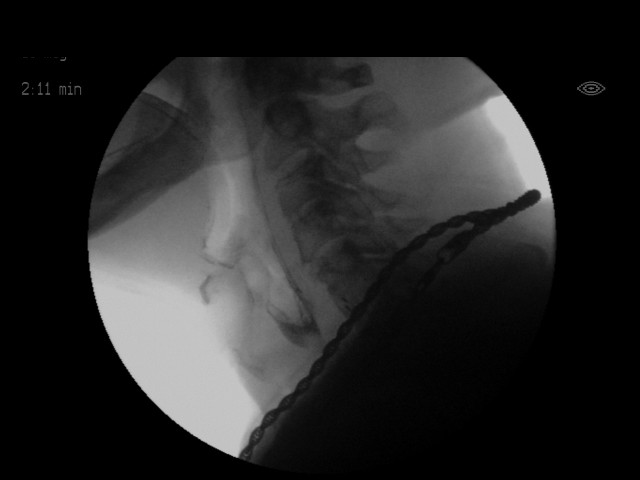
[im 24/24]
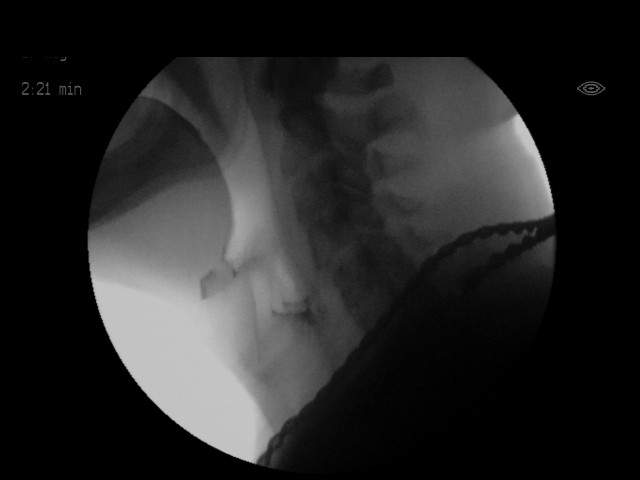

[17 of 24 positions shown; findings below may reference images not displayed]

FINDINGS: Thin liquid- multiple consistent episodes of penetration down to the
level of the vocal cords, questionably better with chin-tuck but not
improving with turning the neck to the side. Weak cough.

Nectar thick liquid- significantly improved, very questionable
penetration

Rosner?Agron Cordero within normal limits

Rosner?Deberry with cracker- within normal limits
IMPRESSION: 1. Multiple inconsistent episodes of laryngeal penetration with thin
liquids. This was significantly improved with nectar thick
consistency. Weak cough. No frank tracheal aspiration seen.

Please refer to the Speech Pathologists report for complete details
and recommendations.

## 2019-03-03 DEATH — deceased
